# Patient Record
Sex: Female | Born: 1995 | Race: Black or African American | Hispanic: No | Marital: Single
Health system: Southern US, Community
[De-identification: ages and names within clinical notes are randomized; demographics above are authoritative.]

---
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
## 2014-03-01 IMAGING — CR DG FOOT COMPLETE 3+V*L*
1 series · 3 of 3 positions shown · non-contrast
Comparison: none

REASON FOR EXAM: FOOT LACERATION AND BRUISING
COMMENTS:

PROCEDURE:     DXR - DXR FOOT LT COMP W/OBLIQUES  - February 09, 2013  [DATE]
RESULT:     Left foot images demonstrate no evidence of fracture,
dislocation or radiopaque foreign body.

[Series 1: x foot ap left · 0.14mm/px · 3 of 3 slices shown]
[im 1/3]
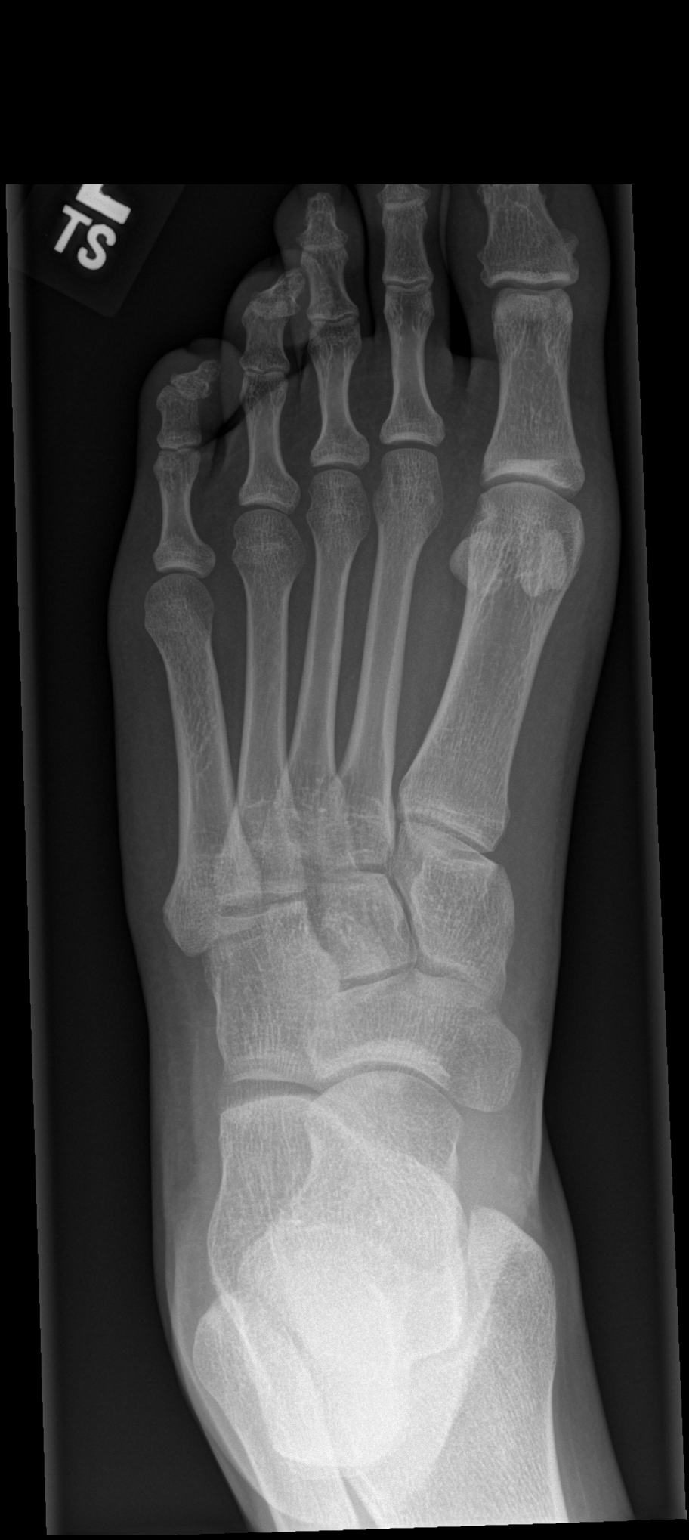
[im 2/3]
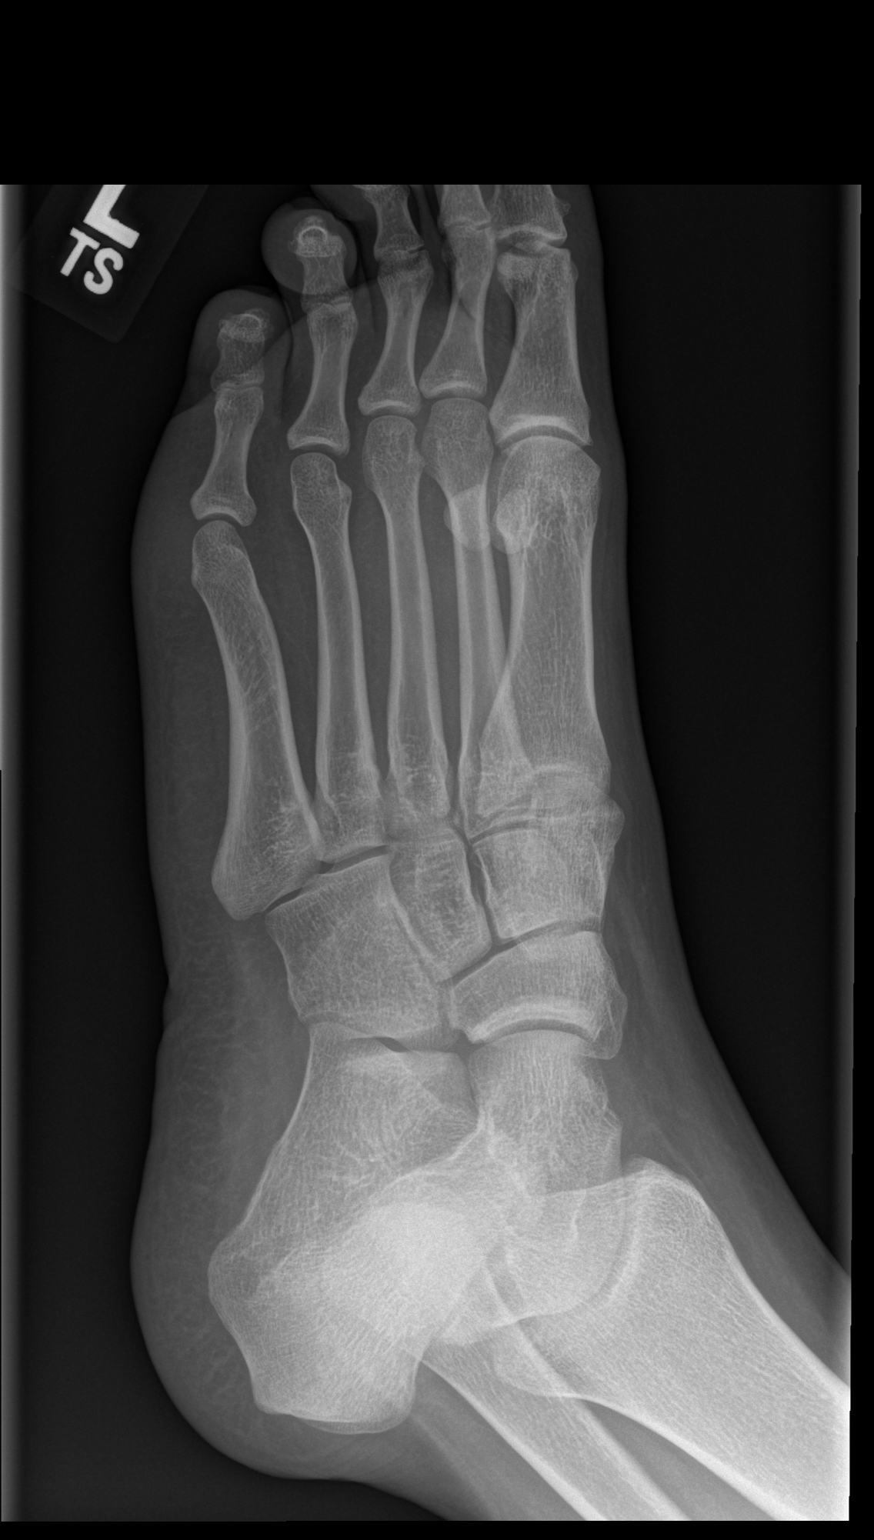
[im 3/3]
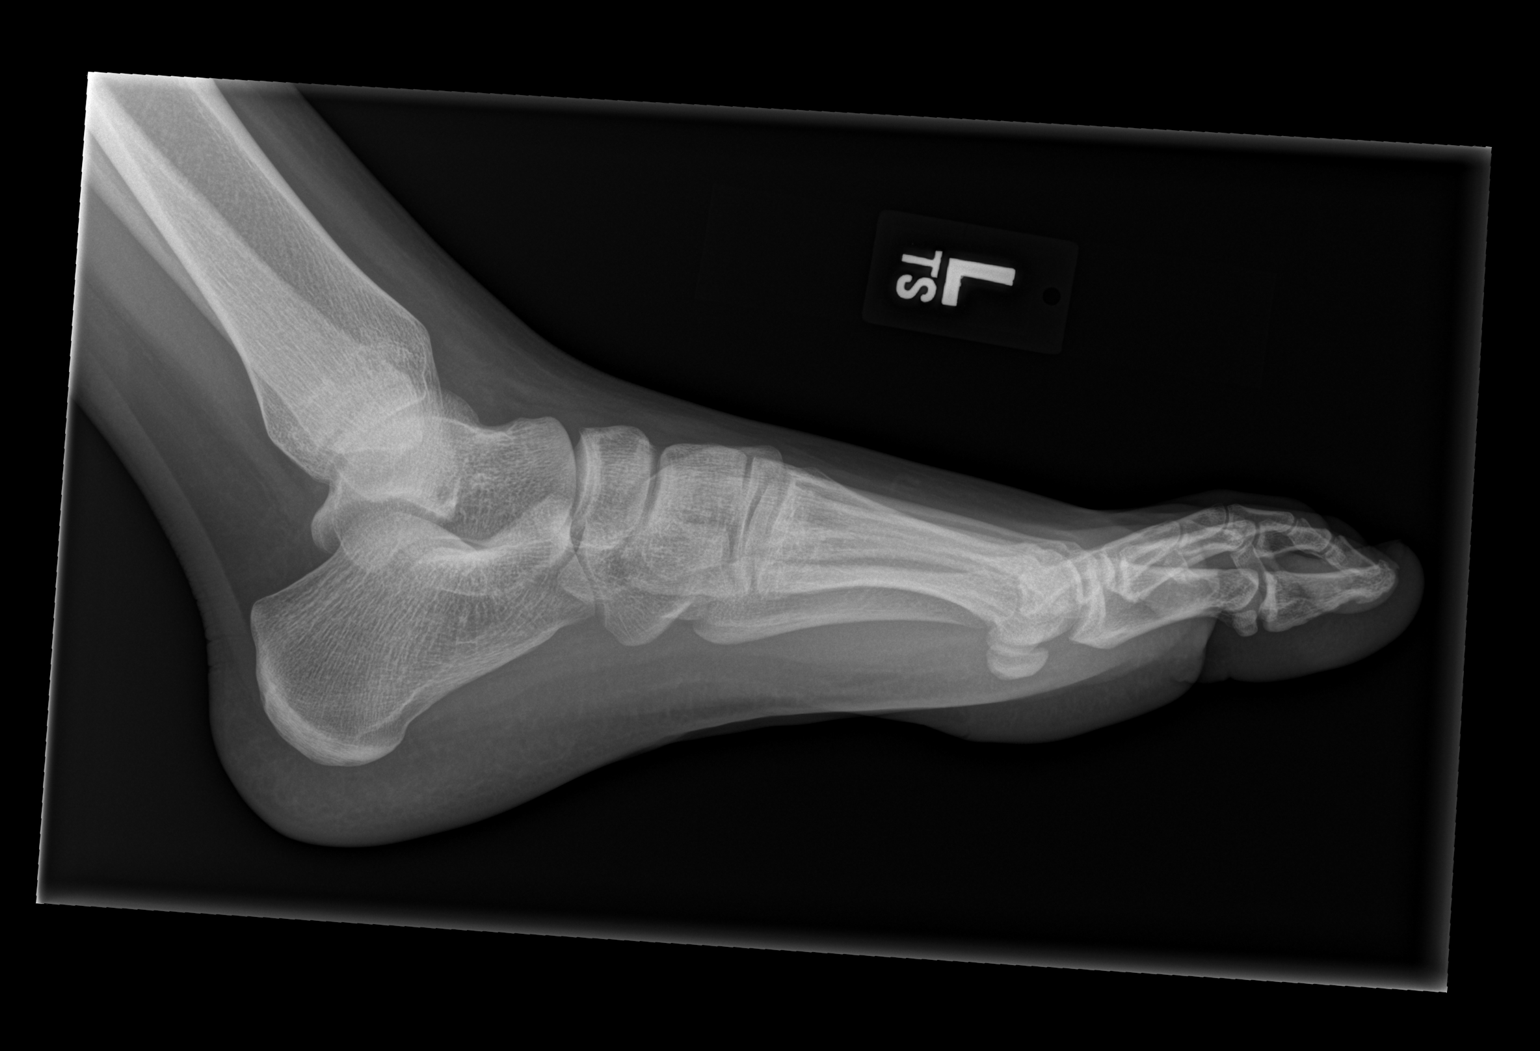

[3 of 3 positions shown; findings below may reference images not displayed]

IMPRESSION: Please see above.

[REDACTED]

## 2019-02-07 ENCOUNTER — Other Ambulatory Visit: Payer: Self-pay | Admitting: Family Medicine

## 2019-02-07 DIAGNOSIS — N6324 Unspecified lump in the left breast, lower inner quadrant: Secondary | ICD-10-CM

## 2020-02-23 ENCOUNTER — Encounter: Payer: Self-pay | Admitting: Physical Therapy

## 2020-02-23 ENCOUNTER — Other Ambulatory Visit: Payer: Self-pay

## 2020-02-23 ENCOUNTER — Ambulatory Visit: Payer: 59 | Attending: Family Medicine | Admitting: Physical Therapy

## 2020-02-23 DIAGNOSIS — M5410 Radiculopathy, site unspecified: Secondary | ICD-10-CM | POA: Diagnosis present

## 2020-02-23 DIAGNOSIS — M545 Low back pain, unspecified: Secondary | ICD-10-CM

## 2020-02-23 DIAGNOSIS — R198 Other specified symptoms and signs involving the digestive system and abdomen: Secondary | ICD-10-CM | POA: Insufficient documentation

## 2020-02-23 NOTE — Therapy (Signed)
[facility] [address] [place], [address] Phone: [phone]   Fax:  [phone]  Physical Therapy Evaluation  Patient Details  Name: Deanna Zamora MRN: [id number] Date of Birth: [birth year] No data recorded  Encounter Date: 02/23/2020   PT End of Session - 02/23/20 1705    Visit Number 1    Number of Visits 16    Date for PT Re-Evaluation 04/19/20    PT Start Time 1600    PT Stop Time 1700    PT Time Calculation (min) 60 min    Activity Tolerance Patient tolerated treatment well;Patient limited by pain    Behavior During Therapy [facility] for tasks assessed/performed           History reviewed. No pertinent past medical history.  History reviewed. No pertinent surgical history.  There were no vitals filed for this visit.    Subjective Assessment - 02/23/20 1822    Pertinent History Pt is a 24 y.o. female with persistent low back pain with LLE radicular sx.  Pt reports back pain began 2.5 weeks ago as a stabbing pain (Worst: 9/10, Best: 0/10, Current: 6/10) that intermittently radiates as an electrical shooting pain down the back of her L leg to the anterior knee and medial ankle.  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  Pt was prescribed prednisone and noticed some improvement but did not resolve condition.  Aggravating factors include prolonged standing and sitting, picking up/carrying weighted objects (especially unstable objects, ie. ice bucket at work).  The only easing factor noted is laying down, and it takes a while to get comfortable at night for sleep.  Pt reports she is unable to fulfill her job duties at work and  avoids aggravating behaviors; pt reports she is able to get used to the pain at times when she is not thinking about it.  Pt works 50-55 hours a week at [employer] and picks up part time shifts at [employer] when she is able; pt was active prior to her new job, engaging in yoga and regular exercise.  Pt states she is very clumsy and has had many falls over the past 6 months (>50) with 5 falls reported as significant "hit the floor" falls; stating she commonly slips and she often wears slides.  Pt reports hx of low back injury with carrying drums downstairs in her high school band; resolved with PT for several months.  Pt would like to reduce pain and strengthen her back.  Pt denies N/V, B&B changes, unexplained weight fluctuation, saddle paresthesia, fever, night sweats, or unrelenting night pain at this time.    Limitations Sitting;House hold activities;Standing;Lifting    How long can you sit comfortably? 30-45 min    How long can you stand comfortably? 15 min on hard surfaces    How long can you walk comfortably? 1 hour    Patient Stated Goals Reduce pain, strengthen back    Currently in Pain? Yes    Pain Score 6     Pain Location Back    Pain Orientation Lower    Pain Descriptors / Indicators Stabbing    Pain Type Acute pain  Pain Radiating Towards L buttock. lateral thigh, anterior knee/lower leg/foot    Pain Onset 1 to 4 weeks ago    Pain Frequency Intermittent    Aggravating Factors  prolonged standing, prolonged sitting, picking up objects that are unstable (ice bucket), lifting/carrying heavy objects    Pain Relieving Factors laying down    Effect of Pain on Daily Activities difficulty getting comfortable at night, gets up to walk around at night; worse in the mornings; avoids painful movements, unable to perform job tasks           OBJECTIVE  Mental Status Patient is oriented to person, place and time.  Recent memory is intact.  Remote memory is intact.  Attention span and  concentration are intact.  Expressive speech is intact.  Patient's fund of knowledge is within normal limits for educational level.  SENSATION: Pt reports impaired sensation at L4-L5 on LLE Proprioception and hot/cold testing deferred on this date   MUSCULOSKELETAL: Tremor: None Bulk: Normal Tone: Normal No visible step-off along spinal column  Posture Increased lumbar lordosis, decreased thoracic kyphosis, stiff posture  Gait Antalgic gait with decreased step length on R, decreased stance time on L, decreased gait speed, reduced trunk rotation   Palpation Pt TTP along lumbar paraspinals on L>R (identified as concordant pain), increased tightness and tenderness noted on L glute max/med along ilium, no pain noted along hamstrings  Strength (out of 5) R/L 5/4-* Hip flexion 5/4-* Hip ER 5/5 Hip IR 5/3+* Hip abduction 4/3+* Hip adduction 3+/3-* Hip extension (concordant) 5/3+* Knee extension (concordant pain) 5/4-* Knee flexion (concordant pain) 5/5 Ankle dorsiflexion 5/5 Ankle plantarflexion *Indicates pain   AROM (degrees) R/L (all movements include overpressure unless otherwise stated) Lumbar grossly WNL B, pain with lumbar extension and R lateral flexion, pt reports that other movements are not painful but back "does not want to move" Hip IR (0-45): WNL B, painful on L Hip ER (0-45): WNL B, painful on L Hip Flexion (0-125): WNL B, painful on L Hip Abduction (0-40): R: WNL L:25d* Hip extension (0-15): R: WNL* L: 5d* *Indicates pain  PROM (degrees) Hip abduction and extension WNL with PROM, all motions painful on L  Repeated Movements Temporary centralization of symptoms with repeated lumbar extension.    Muscle Length Hamstrings: Negative B Ely: Negative Thomas: Negative Ober: Negative   Passive Accessory Intervertebral Motion (PAIVM) Pt reports reproduction of back pain with CPA L4-L5 with radiating sx into L buttock.  Pt reports tenderness and less pain  with CPA L1-3 and L UPA of L4-L5 with no radiating sx noted.  No pain noted with R UPA of L1-5, just muscle soreness. Pt with increased guarding throughout.   SPECIAL TESTS Lumbar Radiculopathy and Discogenic: Centralization and Peripheralization: Centralization temporarily noted with extension. Slump: R: Negative L: Positive SLR: R: Negative L: Positive  Scoliosis: Adam's Forward Bend Test: Negative  Hip: FABER: R: Positive, pain located in low back L: Positive and guarded, unable to obtain full ER d/t pain, pain located in low back FADIR: R: Negative L: Positive and guarded  Functional Tasks Lifting: Limited trunk flexion, lumbar lordosis maintained, bends at knees and hips, lifts object close to body  Squatting: Limited trunk flexion, lumbar lordosis maintained, bends at knees and hips  Plank test: Positive, unable to hold without excessive lumbar lordosis  TherEx -Glute bridge x10 with cueing for core and glute activation; pt with back pain noted with movement (consistent throughout session, pain with movement of LLE and trunk), pt  educated on performing posterior pelvic tilts for core activation when pain is flared up for low level strengthening -Piriformis stretch x30 sec; pt with difficulty with forward trunk flexion d/t LBP, pt educated on stretching in reduced pain range -Repeated extensions x10; pt educated to perform hourly daily to better identify the mechanics of her radiculopathy     Objective measurements completed on examination: See above findings.               PT Education - 02/23/20 1821    Education Details Patient was educated on diagnosis, anatomy and pathology involved, prognosis, role of PT, and was given an HEP, demonstrating exercise with proper form following verbal and tactile cues, and was given a paper hand out to continue exercise at home. Pt was educated on and agreed to plan of care.    Person(s) Educated Patient    Methods  Explanation;Demonstration;Tactile cues;Verbal cues;Handout    Comprehension Verbalized understanding;Returned demonstration;Verbal cues required;Need further instruction            PT Short Term Goals - 02/23/20 1706      PT SHORT TERM GOAL #1   Title Pt will be independent with HEP to improve strength and decrease pain for improved pain-free function at work.    Baseline 02/23/20 Educated and given handout    Time 8    Period Weeks    Status New    Target Date 04/19/20             PT Long Term Goals - 02/23/20 1707      PT LONG TERM GOAL #1   Title Pt will increase FOTO score to 63 to demonstrate improved functional mobility with ADLs.    Baseline 02/23/20 40    Time 8    Period Weeks    Status New    Target Date 04/19/20      PT LONG TERM GOAL #2   Title Pt will decrease worst back pain as reported on NPRS by at least 2 points in order to demonstrate clinically significant reduction in back pain.    Baseline 02/23/20 Worst 9/10    Time 8    Period Weeks    Status New    Target Date 04/19/20      PT LONG TERM GOAL #3   Title Pt will increase strength of hip extension by at least 1/2 MMT grade in order to demonstrate improvement in strength and function.    Baseline 02/23/20 hip ext R/L 3+/3-*    Time 8    Period Weeks    Status New    Target Date 04/19/20      PT LONG TERM GOAL #4   Title Pt will be able to lift and carry 20# object with <5/10 back pain for improved funcitonal mobility at work with less pain.    Baseline 02/23/20 Pt avoids carrying heavy objects d/t worst back pain 9/10    Time 8    Period Weeks    Status New    Target Date 03/22/20                  Plan - 02/23/20 1808    Clinical Impression Statement Pt is a pleasant 24 y.o. female referred for low back pain.  Pt presents with acute pain of the lumbar spine with radiculopathy of the LLE.  PT reveals deficits in core stability, LLE strength, LLE sensation, hip AROM, lumbar/LLE pain, and  posture.  Pt activity limitations of standing,  sitting, lifting, and carrying interfere with full participation in job demands and household ADLs.  Pt will benefit from skilled PT to address deficits and reduce pain to promote improved funcitonal mobility with less pain at work and home.    Examination-Activity Limitations Lift;Stand;Locomotion Level;Bend;Carry;Squat;Sit;Transfers    Examination-Participation Restrictions Interpersonal Relationship;Community Activity    Stability/Clinical Decision Making Evolving/Moderate complexity    Clinical Decision Making Moderate    Rehab Potential Good    PT Frequency 2x / week    PT Duration 8 weeks    PT Treatment/Interventions ADLs/Self Care Home Management;Balance training;Moist Heat;Traction;Gait training;Stair training;Functional mobility training;Therapeutic activities;Therapeutic exercise;Neuromuscular re-education;Cognitive remediation;Patient/family education;Manual techniques;Passive range of motion;Dry needling    PT Next Visit Plan assess balance, core stabilization, follow up on HEP    PT Home Exercise Plan Repeated extensions, piriformis stretch, glute bridge    Consulted and Agree with Plan of Care Patient           Patient will benefit from skilled therapeutic intervention in order to improve the following deficits and impairments:  Abnormal gait, Impaired sensation, Improper body mechanics, Pain, Decreased coordination, Decreased mobility, Postural dysfunction, Decreased activity tolerance, Decreased range of motion, Decreased balance  Visit Diagnosis: Acute left-sided low back pain, unspecified whether sciatica present  Radicular pain of left lower extremity  Abdominal weakness     Problem List There are no problems to display for this patient.   Hilda Lias DPT Ricarda Frame, SPT Ricarda Frame 02/23/2020, 6:57 PM  [facility] PH[facility] SPORTS MEDICINE [address], [place], [address] Phone: [phone]   Fax:  [phone]  Name: Deanna Zamora MRN: [id number] Date of Birth: May 02, 1996

## 2020-02-24 ENCOUNTER — Encounter: Payer: Self-pay | Admitting: Physical Therapy

## 2020-02-28 ENCOUNTER — Ambulatory Visit: Payer: 59 | Admitting: Physical Therapy

## 2020-02-29 ENCOUNTER — Other Ambulatory Visit: Payer: Self-pay

## 2020-02-29 ENCOUNTER — Ambulatory Visit: Payer: 59 | Admitting: Physical Therapy

## 2020-03-01 ENCOUNTER — Ambulatory Visit: Payer: 59 | Admitting: Physical Therapy

## 2020-03-05 ENCOUNTER — Ambulatory Visit: Payer: 59 | Attending: Family Medicine | Admitting: Physical Therapy

## 2020-03-08 ENCOUNTER — Encounter: Payer: 59 | Admitting: Physical Therapy

## 2020-03-12 ENCOUNTER — Ambulatory Visit: Payer: 59 | Admitting: Physical Therapy

## 2020-03-13 ENCOUNTER — Encounter: Payer: 59 | Admitting: Physical Therapy

## 2020-03-15 ENCOUNTER — Encounter: Payer: 59 | Admitting: Physical Therapy

## 2020-03-16 ENCOUNTER — Encounter: Payer: 59 | Admitting: Physical Therapy

## 2020-03-19 ENCOUNTER — Ambulatory Visit: Payer: 59 | Admitting: Physical Therapy

## 2020-03-21 ENCOUNTER — Encounter: Payer: 59 | Admitting: Physical Therapy

## 2020-03-26 ENCOUNTER — Encounter: Payer: 59 | Admitting: Physical Therapy

## 2020-03-28 ENCOUNTER — Encounter: Payer: 59 | Admitting: Physical Therapy

## 2020-04-02 ENCOUNTER — Encounter: Payer: 59 | Admitting: Physical Therapy

## 2020-04-03 ENCOUNTER — Encounter: Payer: 59 | Admitting: Physical Therapy

## 2020-04-04 ENCOUNTER — Encounter: Payer: 59 | Admitting: Physical Therapy

## 2020-04-06 ENCOUNTER — Encounter: Payer: 59 | Admitting: Physical Therapy

## 2020-04-10 ENCOUNTER — Encounter: Payer: 59 | Admitting: Physical Therapy

## 2020-04-11 ENCOUNTER — Encounter: Payer: 59 | Admitting: Physical Therapy

## 2020-04-12 ENCOUNTER — Encounter: Payer: 59 | Admitting: Physical Therapy

## 2020-04-13 ENCOUNTER — Encounter: Payer: 59 | Admitting: Physical Therapy

## 2020-04-16 ENCOUNTER — Encounter: Payer: 59 | Admitting: Physical Therapy

## 2020-04-17 ENCOUNTER — Encounter: Payer: 59 | Admitting: Physical Therapy

## 2020-04-18 ENCOUNTER — Encounter: Payer: 59 | Admitting: Physical Therapy

## 2020-04-23 ENCOUNTER — Encounter: Payer: 59 | Admitting: Physical Therapy

## 2020-04-25 ENCOUNTER — Encounter: Payer: 59 | Admitting: Physical Therapy

## 2020-04-26 ENCOUNTER — Encounter: Payer: 59 | Admitting: Physical Therapy

## 2020-04-30 ENCOUNTER — Encounter: Payer: 59 | Admitting: Physical Therapy

## 2020-05-02 ENCOUNTER — Encounter: Payer: 59 | Admitting: Physical Therapy

## 2020-05-03 ENCOUNTER — Encounter: Payer: 59 | Admitting: Physical Therapy

## 2021-07-16 ENCOUNTER — Other Ambulatory Visit: Payer: Self-pay | Admitting: Family Medicine

## 2021-07-16 DIAGNOSIS — N6311 Unspecified lump in the right breast, upper outer quadrant: Secondary | ICD-10-CM

## 2023-09-30 ENCOUNTER — Other Ambulatory Visit: Payer: Self-pay | Admitting: Family Medicine

## 2023-09-30 DIAGNOSIS — N6324 Unspecified lump in the left breast, lower inner quadrant: Secondary | ICD-10-CM

## 2023-10-02 ENCOUNTER — Ambulatory Visit
Admission: RE | Admit: 2023-10-02 | Discharge: 2023-10-02 | Disposition: A | Discharge status: Home / Self Care | Payer: Self-pay | Source: Ambulatory Visit | Attending: Family Medicine | Admitting: Family Medicine

## 2023-10-02 ENCOUNTER — Encounter: Payer: Self-pay | Admitting: Family Medicine

## 2023-10-02 DIAGNOSIS — N6324 Unspecified lump in the left breast, lower inner quadrant: Secondary | ICD-10-CM | POA: Insufficient documentation

## 2023-10-06 ENCOUNTER — Other Ambulatory Visit: Payer: Self-pay | Admitting: Family Medicine

## 2023-10-06 DIAGNOSIS — N63 Unspecified lump in unspecified breast: Secondary | ICD-10-CM

## 2024-04-01 ENCOUNTER — Inpatient Hospital Stay: Admission: RE | Admit: 2024-04-01 | Source: Ambulatory Visit

## 2024-05-16 ENCOUNTER — Emergency Department

## 2024-05-16 ENCOUNTER — Emergency Department
Admission: EM | Admit: 2024-05-16 | Discharge: 2024-05-16 | Disposition: A | Discharge status: Home / Self Care | Attending: Emergency Medicine | Admitting: Emergency Medicine

## 2024-05-16 ENCOUNTER — Encounter: Payer: Self-pay | Admitting: Emergency Medicine

## 2024-05-16 DIAGNOSIS — E876 Hypokalemia: Secondary | ICD-10-CM | POA: Insufficient documentation

## 2024-05-16 DIAGNOSIS — M5417 Radiculopathy, lumbosacral region: Secondary | ICD-10-CM | POA: Insufficient documentation

## 2024-05-16 DIAGNOSIS — M545 Low back pain, unspecified: Secondary | ICD-10-CM | POA: Diagnosis present

## 2024-05-16 DIAGNOSIS — R002 Palpitations: Secondary | ICD-10-CM

## 2024-05-16 DIAGNOSIS — R079 Chest pain, unspecified: Secondary | ICD-10-CM | POA: Insufficient documentation

## 2024-05-16 DIAGNOSIS — D72829 Elevated white blood cell count, unspecified: Secondary | ICD-10-CM | POA: Insufficient documentation

## 2024-05-16 DIAGNOSIS — R0602 Shortness of breath: Secondary | ICD-10-CM | POA: Insufficient documentation

## 2024-05-16 DIAGNOSIS — R600 Localized edema: Secondary | ICD-10-CM | POA: Insufficient documentation

## 2024-05-16 LAB — TROPONIN I (HIGH SENSITIVITY): Troponin I (High Sensitivity): 2 ng/L (ref <18)

## 2024-05-16 LAB — CBC WITH DIFFERENTIAL/PLATELET
Abs Immature Granulocytes: 0.05 K/uL (ref 0.00–0.07)
Basophils Absolute: 0 K/uL (ref 0.0–0.1)
Basophils Relative: 0 %
Eosinophils Absolute: 0.1 K/uL (ref 0.0–0.5)
Eosinophils Relative: 1 %
HCT: 32.3 % — ABNORMAL LOW (ref 36.0–46.0)
Hemoglobin: 10.7 g/dL — ABNORMAL LOW (ref 12.0–15.0)
Immature Granulocytes: 0 %
Lymphocytes Relative: 54 %
Lymphs Abs: 6.4 K/uL — ABNORMAL HIGH (ref 0.7–4.0)
MCH: 28.2 pg (ref 26.0–34.0)
MCHC: 33.1 g/dL (ref 30.0–36.0)
MCV: 85.2 fL (ref 80.0–100.0)
Monocytes Absolute: 0.7 K/uL (ref 0.1–1.0)
Monocytes Relative: 5 %
Neutro Abs: 4.9 K/uL (ref 1.7–7.7)
Neutrophils Relative %: 40 %
Platelets: 313 K/uL (ref 150–400)
RBC: 3.79 MIL/uL — ABNORMAL LOW (ref 3.87–5.11)
RDW: 15 % (ref 11.5–15.5)
WBC: 12.2 K/uL — ABNORMAL HIGH (ref 4.0–10.5)
nRBC: 0 % (ref 0.0–0.2)

## 2024-05-16 LAB — URINALYSIS, W/ REFLEX TO CULTURE (INFECTION SUSPECTED)
Bacteria, UA: NONE SEEN
Bilirubin Urine: NEGATIVE
Glucose, UA: NEGATIVE mg/dL
Hgb urine dipstick: NEGATIVE
Ketones, ur: NEGATIVE mg/dL
Leukocytes,Ua: NEGATIVE
Nitrite: NEGATIVE
Protein, ur: NEGATIVE mg/dL
Specific Gravity, Urine: 1.033 — ABNORMAL HIGH (ref 1.005–1.030)
pH: 5 (ref 5.0–8.0)

## 2024-05-16 LAB — COMPREHENSIVE METABOLIC PANEL WITH GFR
ALT: 10 U/L (ref 0–44)
AST: 12 U/L — ABNORMAL LOW (ref 15–41)
Albumin: 3.2 g/dL — ABNORMAL LOW (ref 3.5–5.0)
Alkaline Phosphatase: 30 U/L — ABNORMAL LOW (ref 38–126)
Anion gap: 8 (ref 5–15)
BUN: 18 mg/dL (ref 6–20)
CO2: 29 mmol/L (ref 22–32)
Calcium: 8.1 mg/dL — ABNORMAL LOW (ref 8.9–10.3)
Chloride: 102 mmol/L (ref 98–111)
Creatinine, Ser: 0.77 mg/dL (ref 0.44–1.00)
GFR, Estimated: >60 mL/min (ref >60)
Glucose, Bld: 97 mg/dL (ref 70–99)
Potassium: 2.9 mmol/L — ABNORMAL LOW (ref 3.5–5.1)
Sodium: 139 mmol/L (ref 135–145)
Total Bilirubin: 0.3 mg/dL (ref 0.0–1.2)
Total Protein: 5.9 g/dL — ABNORMAL LOW (ref 6.5–8.1)

## 2024-05-16 LAB — MAGNESIUM: Magnesium: 2 mg/dL (ref 1.7–2.4)

## 2024-05-16 LAB — PREGNANCY, URINE: Preg Test, Ur: NEGATIVE

## 2024-05-16 LAB — TSH: TSH: 2.601 u[IU]/mL (ref 0.350–4.500)

## 2024-05-16 LAB — D-DIMER, QUANTITATIVE: D-Dimer, Quant: 0.3 ug{FEU}/mL (ref 0.00–0.50)

## 2024-05-16 MED ORDER — KETOROLAC TROMETHAMINE 30 MG/ML IJ SOLN
30.0000 mg | Freq: Once | INTRAMUSCULAR | Status: AC
  Administered 2024-05-16: 30 mg via INTRAVENOUS
  Filled 2024-05-16: qty 1

## 2024-05-16 MED ORDER — ONDANSETRON 4 MG PO TBDP: 4.0000 mg | ORAL_TABLET | Freq: Four times a day (QID) | ORAL | 0 refills | Status: AC | PRN

## 2024-05-16 MED ORDER — POTASSIUM CHLORIDE CRYS ER 20 MEQ PO TBCR: 20.0000 meq | EXTENDED_RELEASE_TABLET | Freq: Two times a day (BID) | ORAL | 0 refills | Status: AC

## 2024-05-16 MED ORDER — IBUPROFEN 800 MG PO TABS: 800.0000 mg | ORAL_TABLET | Freq: Three times a day (TID) | ORAL | 0 refills | Status: AC | PRN

## 2024-05-16 MED ORDER — POTASSIUM CHLORIDE 20 MEQ PO PACK
60.0000 meq | PACK | Freq: Once | ORAL | Status: AC
  Administered 2024-05-16: 60 meq via ORAL
  Filled 2024-05-16: qty 3

## 2024-05-16 MED ORDER — LIDOCAINE 5 % EX PTCH: 1.0000 | MEDICATED_PATCH | CUTANEOUS | 0 refills | Status: AC

## 2024-05-16 MED ORDER — HYDROCODONE-ACETAMINOPHEN 5-325 MG PO TABS: 2.0000 | ORAL_TABLET | Freq: Three times a day (TID) | ORAL | 0 refills | Status: AC | PRN
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
## 2024-05-16 NOTE — Discharge Instructions (Addendum)
 You are being provided a prescription for opiates (also known as narcotics) for pain control.  Opiates can be addictive and should only be used when absolutely necessary for pain control when other alternatives do not work.  We recommend you only use them for the recommended amount of time and only as prescribed.  Please do not take with other sedative medications or alcohol.  Please do not drive, operate machinery, make important decisions while taking opiates.  Please note that these medications can be addictive and have high abuse potential.  Patients can become addicted to narcotics after only taking them for a few days.  Please keep these medications locked away from children, teenagers or any family members with history of substance abuse.  Narcotic pain medicine may also make you constipated.  You may use over-the-counter medications such as MiraLAX, Colace to prevent constipation.  If you become constipated, you may use over-the-counter enemas as needed.  Itching and nausea are also common side effects of narcotic pain medication.  If you develop uncontrolled vomiting or a rash, please stop these medications and seek medical care.   I recommend creasing your water intake and decreasing your sodium intake.  I recommend keeping your legs elevated when at rest and wearing compression hose to help with swelling.  I suspect that your swelling is secondary to recent prednisone use.  Your blood work showed no signs of renal failure, liver failure, heart failure.  No fluid on your lungs on chest x-ray.  Urine did not show any sign of infection.  Your potassium level was slightly low which can cause palpitations, irregular heart rhythms.  We have given you replacement for this.  I recommend this be rechecked by your doctor in 1 to 2 weeks.   I suspect that your low back pain is caused from a pinched nerve or herniated disc.  I recommend close follow-up with your primary care doctor.  You may need an MRI of  your back if symptoms do not improve with anti-inflammatories, rest, alternating heat and ice, stretching.  If you develop numbness in your inner thighs or around your buttocks or genitalia, difficulty walking secondary to weakness in your legs, cannot hold your bowel or bladder, cannot empty your bladder, worsening back pain, please return to the emergency department.
